# Patient Record
Sex: Female | Born: 1985 | Race: White | Hispanic: No | Marital: Married | Smoking: Never smoker
Health system: Southern US, Community
[De-identification: ages and names within clinical notes are randomized; demographics above are authoritative.]

## PROBLEM LIST (undated history)

## (undated) DIAGNOSIS — Z789 Other specified health status: Secondary | ICD-10-CM

## (undated) HISTORY — PX: NO PAST SURGERIES: SHX2092

## (undated) HISTORY — PX: CHOLECYSTECTOMY: SHX55

---
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
## 2005-07-04 ENCOUNTER — Ambulatory Visit: Payer: Self-pay | Admitting: Family Medicine

## 2005-09-15 ENCOUNTER — Ambulatory Visit: Payer: Self-pay | Admitting: Family Medicine

## 2006-06-13 ENCOUNTER — Ambulatory Visit: Payer: Self-pay | Admitting: Family Medicine

## 2006-09-25 ENCOUNTER — Ambulatory Visit: Payer: Self-pay | Admitting: Family Medicine

## 2006-10-10 ENCOUNTER — Encounter (INDEPENDENT_AMBULATORY_CARE_PROVIDER_SITE_OTHER): Payer: Self-pay | Admitting: Specialist

## 2006-10-10 ENCOUNTER — Inpatient Hospital Stay (HOSPITAL_COMMUNITY): Admission: AD | Admit: 2006-10-10 | Discharge: 2006-10-11 | Payer: Self-pay | Admitting: Obstetrics and Gynecology

## 2006-10-10 ENCOUNTER — Ambulatory Visit (HOSPITAL_COMMUNITY): Admission: RE | Admit: 2006-10-10 | Discharge: 2006-10-10 | Payer: Self-pay | Admitting: Obstetrics and Gynecology

## 2006-10-10 ENCOUNTER — Other Ambulatory Visit: Admission: RE | Admit: 2006-10-10 | Discharge: 2006-10-10 | Payer: Self-pay | Admitting: Obstetrics and Gynecology

## 2006-10-13 ENCOUNTER — Inpatient Hospital Stay (HOSPITAL_COMMUNITY): Admission: AD | Admit: 2006-10-13 | Discharge: 2006-10-13 | Payer: Self-pay | Admitting: Obstetrics and Gynecology

## 2006-10-19 ENCOUNTER — Ambulatory Visit: Payer: Self-pay | Admitting: Family Medicine

## 2007-01-10 ENCOUNTER — Ambulatory Visit: Payer: Self-pay | Admitting: Family Medicine

## 2007-04-24 ENCOUNTER — Other Ambulatory Visit: Admission: RE | Admit: 2007-04-24 | Discharge: 2007-04-24 | Payer: Self-pay | Admitting: Obstetrics and Gynecology

## 2007-07-18 HISTORY — PX: CHOLECYSTECTOMY: SHX55

## 2007-08-05 ENCOUNTER — Inpatient Hospital Stay (HOSPITAL_COMMUNITY): Admission: AD | Admit: 2007-08-05 | Discharge: 2007-08-05 | Payer: Self-pay | Admitting: Obstetrics & Gynecology

## 2007-08-05 ENCOUNTER — Ambulatory Visit: Payer: Self-pay | Admitting: Obstetrics and Gynecology

## 2007-11-01 ENCOUNTER — Ambulatory Visit: Payer: Self-pay | Admitting: Obstetrics and Gynecology

## 2007-11-01 ENCOUNTER — Inpatient Hospital Stay (HOSPITAL_COMMUNITY): Admission: AD | Admit: 2007-11-01 | Discharge: 2007-11-01 | Payer: Self-pay | Admitting: Obstetrics and Gynecology

## 2007-11-04 ENCOUNTER — Ambulatory Visit: Payer: Self-pay | Admitting: Obstetrics and Gynecology

## 2007-11-04 ENCOUNTER — Inpatient Hospital Stay (HOSPITAL_COMMUNITY): Admission: AD | Admit: 2007-11-04 | Discharge: 2007-11-04 | Payer: Self-pay | Admitting: Obstetrics & Gynecology

## 2007-11-05 ENCOUNTER — Inpatient Hospital Stay (HOSPITAL_COMMUNITY): Admission: AD | Admit: 2007-11-05 | Discharge: 2007-11-05 | Payer: Self-pay | Admitting: Obstetrics & Gynecology

## 2007-11-05 ENCOUNTER — Ambulatory Visit: Payer: Self-pay | Admitting: Family

## 2007-11-05 ENCOUNTER — Inpatient Hospital Stay (HOSPITAL_COMMUNITY): Admission: AD | Admit: 2007-11-05 | Discharge: 2007-11-07 | Payer: Self-pay | Admitting: Family Medicine

## 2007-11-09 ENCOUNTER — Ambulatory Visit: Payer: Self-pay | Admitting: Obstetrics and Gynecology

## 2007-11-09 ENCOUNTER — Inpatient Hospital Stay (HOSPITAL_COMMUNITY): Admission: AD | Admit: 2007-11-09 | Discharge: 2007-11-09 | Payer: Self-pay | Admitting: Obstetrics and Gynecology

## 2010-07-17 NOTE — L&D Delivery Note (Signed)
Delivery Note At  a viable female was delivered via  (Presentation: right occiput anterior ;  ).  APGAR:8/9 , ; weight 5lb7.3oz Placenta status: intact, .  Cord: 3 vessel cord with the following complications: none. Donor cord blood sent.   Anesthesia: epidural Episiotomy: none Lacerations: none Suture Repair: na Est. Blood Loss (mL): less than 150  Mom to postpartum.  Baby to nursery-stable.  [name] [name] 02/03/2011, 10:37 AM

## 2010-08-01 ENCOUNTER — Other Ambulatory Visit
Admission: RE | Admit: 2010-08-01 | Discharge: 2010-08-01 | Payer: Self-pay | Source: Home / Self Care | Admitting: Obstetrics and Gynecology

## 2010-10-21 ENCOUNTER — Inpatient Hospital Stay (HOSPITAL_COMMUNITY)
Admission: AD | Admit: 2010-10-21 | Discharge: 2010-10-21 | Disposition: A | Discharge status: Home / Self Care | Payer: Medicaid Other | Source: Ambulatory Visit | Attending: Obstetrics & Gynecology | Admitting: Obstetrics & Gynecology

## 2010-10-21 DIAGNOSIS — O99891 Other specified diseases and conditions complicating pregnancy: Secondary | ICD-10-CM | POA: Insufficient documentation

## 2010-10-21 DIAGNOSIS — N949 Unspecified condition associated with female genital organs and menstrual cycle: Secondary | ICD-10-CM | POA: Insufficient documentation

## 2010-10-21 DIAGNOSIS — E860 Dehydration: Secondary | ICD-10-CM | POA: Insufficient documentation

## 2010-10-21 LAB — URINALYSIS, ROUTINE W REFLEX MICROSCOPIC
Hgb urine dipstick: NEGATIVE
Ketones, ur: 15 mg/dL — AB
Nitrite: NEGATIVE
Urobilinogen, UA: 2 mg/dL — ABNORMAL HIGH (ref 0.0–1.0)
pH: 6 (ref 5.0–8.0)

## 2010-10-21 LAB — WET PREP, GENITAL
Clue Cells Wet Prep HPF POC: NONE SEEN
WBC, Wet Prep HPF POC: NONE SEEN
Yeast Wet Prep HPF POC: NONE SEEN

## 2010-10-23 LAB — URINE CULTURE
Culture  Setup Time: 201204070100
Culture: NO GROWTH

## 2010-12-09 ENCOUNTER — Inpatient Hospital Stay (HOSPITAL_COMMUNITY)
Admission: AD | Admit: 2010-12-09 | Discharge: 2010-12-09 | Disposition: A | Discharge status: Home / Self Care | Payer: Medicaid Other | Source: Ambulatory Visit | Attending: Obstetrics & Gynecology | Admitting: Obstetrics & Gynecology

## 2010-12-09 DIAGNOSIS — O2390 Unspecified genitourinary tract infection in pregnancy, unspecified trimester: Secondary | ICD-10-CM | POA: Insufficient documentation

## 2010-12-09 DIAGNOSIS — O4700 False labor before 37 completed weeks of gestation, unspecified trimester: Secondary | ICD-10-CM

## 2010-12-09 DIAGNOSIS — R109 Unspecified abdominal pain: Secondary | ICD-10-CM | POA: Insufficient documentation

## 2010-12-09 DIAGNOSIS — N390 Urinary tract infection, site not specified: Secondary | ICD-10-CM

## 2010-12-09 LAB — URINALYSIS, ROUTINE W REFLEX MICROSCOPIC
Glucose, UA: NEGATIVE mg/dL
Hgb urine dipstick: NEGATIVE
Protein, ur: NEGATIVE mg/dL
pH: 6 (ref 5.0–8.0)

## 2010-12-09 LAB — URINE MICROSCOPIC-ADD ON

## 2010-12-09 LAB — COMPREHENSIVE METABOLIC PANEL
AST: 14 U/L (ref 0–37)
CO2: 23 mEq/L (ref 19–32)
Calcium: 9.4 mg/dL (ref 8.4–10.5)
Chloride: 99 mEq/L (ref 96–112)
Creatinine, Ser: 0.48 mg/dL (ref 0.4–1.2)
GFR calc non Af Amer: >60 mL/min (ref >60)
Glucose, Bld: 86 mg/dL (ref 70–99)
Total Bilirubin: 0.3 mg/dL (ref 0.3–1.2)

## 2010-12-09 LAB — CBC
MCHC: 34 g/dL (ref 30.0–36.0)
Platelets: 204 10*3/uL (ref 150–400)
RDW: 12.8 % (ref 11.5–15.5)
WBC: 9.3 10*3/uL (ref 4.0–10.5)

## 2010-12-09 LAB — WET PREP, GENITAL
Clue Cells Wet Prep HPF POC: NONE SEEN
Trich, Wet Prep: NONE SEEN
Yeast Wet Prep HPF POC: NONE SEEN

## 2010-12-10 LAB — GC/CHLAMYDIA PROBE AMP, GENITAL: GC Probe Amp, Genital: NEGATIVE

## 2010-12-11 LAB — URINE CULTURE
Colony Count: NO GROWTH
Culture  Setup Time: 201205260550
Culture: NO GROWTH

## 2010-12-14 LAB — ABO/RH: RH Type: POSITIVE

## 2010-12-14 LAB — CBC
HCT: 41 % (ref 36–46)
Hemoglobin: 14.2 g/dL (ref 12.0–16.0)

## 2011-01-31 ENCOUNTER — Encounter (HOSPITAL_COMMUNITY): Payer: Self-pay | Admitting: *Deleted

## 2011-01-31 ENCOUNTER — Inpatient Hospital Stay (HOSPITAL_COMMUNITY)
Admission: AD | Admit: 2011-01-31 | Discharge: 2011-02-01 | Disposition: A | Discharge status: Home / Self Care | Payer: Medicaid Other | Source: Ambulatory Visit | Attending: Obstetrics & Gynecology | Admitting: Obstetrics & Gynecology

## 2011-01-31 DIAGNOSIS — O479 False labor, unspecified: Secondary | ICD-10-CM | POA: Insufficient documentation

## 2011-01-31 DIAGNOSIS — Z3490 Encounter for supervision of normal pregnancy, unspecified, unspecified trimester: Secondary | ICD-10-CM

## 2011-01-31 HISTORY — DX: Other specified health status: Z78.9

## 2011-01-31 NOTE — ED Provider Notes (Signed)
History   Chief Complaint:  Contractions   [name] [name] is  25 y.o. G3P0011 No LMP recorded. Patient is pregnant..  Her pregnancy status is positive.  She is [redacted]w[redacted]d by early ultrasound.  She presents complaining of Contractions She started feeling contractions earlier today, occuring every . They then increased in frequency to evry after 5PM. She denies any leakage of fluid, any vaginal bleeding. She describes some greenish/brownish discharge on Sunday.  Complications this pregnancy include early onset of labor at 29wga with bed rest to 34wga. Given terbutaline.    OB History    Grav Para Term Preterm Abortions TAB SAB Ect Mult Living   3 1   1  1   1     20 09: term NSVD 2008: SAB at 2months  Past Medical History  Diagnosis Date  . No pertinent past medical history     Past Surgical History  Procedure Date  . Cholecystectomy 2009    No family history on file.  History  Substance Use Topics  . Smoking status: Never Smoker   . Smokeless tobacco: Not on file  . Alcohol Use: No    Allergies:  Allergies  Allergen Reactions  . Bee Hives    Prescriptions prior to admission  Medication Sig Dispense Refill  . acetaminophen (TYLENOL) 500 MG tablet Take 250 mg by mouth every 6 (six) hours as needed. pain       . promethazine (PHENERGAN) 25 MG tablet Take 25 mg by mouth every 6 (six) hours as needed. nausea         Review of Systems - Negative except per HPI  Physical Exam   Blood pressure 143/93, pulse 97, temperature 97.6 F (36.4 C), temperature source Oral, resp. rate 18, height 5\' 4"  (1.626 m), weight 161 lb 12.8 oz (73.392 kg), SpO2 97.00%.  General: General appearance - alert, well appearing, and in no distress Chest - clear to auscultation, no wheezes, rales or rhonchi, symmetric air entry Heart - normal rate, regular rhythm, normal S1, S2, no murmurs, rubs, clicks or gallops Abdomen - gravid Extremities - peripheral pulses normal, no pedal edema,  no clubbing or cyanosis Focused Gynecological Exam: SVE: 3/50/-2 SVE after 1hr walking: 3/50/-2 TOCO: q76min FHR: 130s, moderate variability, no decels, present accels.   Labs: No results found for this or any previous visit (from the past 24 hour(s)).  Ultrasound Studies: (NOTE: Ultrasound reports are not yet able to be directly imported into notes.  Please "CUT" this portion of the note and either free text the report or "COPY/PASTE" from the report viewer.)  Assessment: Pt not in active labor.    Plan: 1. Discharge patient home with instructions to return if worsening contractions, loss of fluid or decreased fetal movement. Pt offered ambien but refused it. Discharge Medications: Current Discharge Medication List    CONTINUE these medications which have NOT CHANGED   Details  acetaminophen (TYLENOL) 500 MG tablet Take 250 mg by mouth every 6 (six) hours as needed. pain     promethazine (PHENERGAN) 25 MG tablet Take 25 mg by mouth every 6 (six) hours as needed. nausea          Keilah Lemire 01/31/2011, 11:57 PM

## 2011-01-31 NOTE — Progress Notes (Signed)
Pt G3 P1 at 37wks, having contractions every 10-11min.  Denies bleeding or leaking fluid.

## 2011-01-31 NOTE — Progress Notes (Signed)
Contractions, denies bleeding or ROM 

## 2011-02-02 ENCOUNTER — Encounter (HOSPITAL_COMMUNITY): Payer: Self-pay | Admitting: *Deleted

## 2011-02-02 ENCOUNTER — Inpatient Hospital Stay (HOSPITAL_COMMUNITY)
Admission: AD | Admit: 2011-02-02 | Discharge: 2011-02-05 | DRG: 775 | Disposition: A | Discharge status: Home / Self Care | Payer: Medicaid Other | Source: Ambulatory Visit | Attending: Family Medicine | Admitting: Family Medicine

## 2011-02-02 DIAGNOSIS — IMO0001 Reserved for inherently not codable concepts without codable children: Secondary | ICD-10-CM

## 2011-02-02 DIAGNOSIS — Z3400 Encounter for supervision of normal first pregnancy, unspecified trimester: Secondary | ICD-10-CM

## 2011-02-02 DIAGNOSIS — Z3490 Encounter for supervision of normal pregnancy, unspecified, unspecified trimester: Secondary | ICD-10-CM

## 2011-02-02 LAB — CBC
HCT: 37.8 % (ref 36.0–46.0)
Hemoglobin: 13 g/dL (ref 12.0–15.0)
MCH: 29.1 pg (ref 26.0–34.0)
MCHC: 34.4 g/dL (ref 30.0–36.0)
RBC: 4.47 MIL/uL (ref 3.87–5.11)

## 2011-02-02 MED ORDER — LACTATED RINGERS IV SOLN: INTRAVENOUS | Status: DC

## 2011-02-02 NOTE — Progress Notes (Signed)
Contractions lower abd "8" for 3 hrs really bad.  37wks, G3p1

## 2011-02-03 ENCOUNTER — Encounter (HOSPITAL_COMMUNITY): Payer: Self-pay | Admitting: Anesthesiology

## 2011-02-03 ENCOUNTER — Encounter (HOSPITAL_COMMUNITY): Payer: Self-pay | Admitting: *Deleted

## 2011-02-03 ENCOUNTER — Inpatient Hospital Stay (HOSPITAL_COMMUNITY): Payer: Medicaid Other | Admitting: Anesthesiology

## 2011-02-03 DIAGNOSIS — Z3490 Encounter for supervision of normal pregnancy, unspecified, unspecified trimester: Secondary | ICD-10-CM

## 2011-02-03 MED ORDER — CITRIC ACID-SODIUM CITRATE 334-500 MG/5ML PO SOLN
30.0000 mL | ORAL | Status: DC | PRN
  Administered 2011-02-03: 30 mL via ORAL
  Filled 2011-02-03: qty 15

## 2011-02-03 MED ORDER — PRENATAL PLUS 27-1 MG PO TABS
1.0000 | ORAL_TABLET | Freq: Every day | ORAL | Status: DC
  Administered 2011-02-03 – 2011-02-05 (×3): 1 via ORAL
  Filled 2011-02-03 (×3): qty 1

## 2011-02-03 MED ORDER — ZOLPIDEM TARTRATE 5 MG PO TABS: 5.0000 mg | ORAL_TABLET | Freq: Every evening | ORAL | Status: DC | PRN

## 2011-02-03 MED ORDER — FENTANYL 2.5 MCG/ML BUPIVACAINE 1/10 % EPIDURAL INFUSION (WH - ANES)
14.0000 mL/h | INTRAMUSCULAR | Status: DC
  Administered 2011-02-03 (×3): 14 mL/h via EPIDURAL
  Filled 2011-02-03 (×3): qty 60

## 2011-02-03 MED ORDER — DIPHENHYDRAMINE HCL 25 MG PO CAPS: 25.0000 mg | ORAL_CAPSULE | Freq: Four times a day (QID) | ORAL | Status: DC | PRN

## 2011-02-03 MED ORDER — PHENYLEPHRINE 40 MCG/ML (10ML) SYRINGE FOR IV PUSH (FOR BLOOD PRESSURE SUPPORT)
80.0000 ug | PREFILLED_SYRINGE | INTRAVENOUS | Status: DC | PRN
  Filled 2011-02-03 (×2): qty 5

## 2011-02-03 MED ORDER — OXYCODONE-ACETAMINOPHEN 5-325 MG PO TABS
1.0000 | ORAL_TABLET | ORAL | Status: DC | PRN
  Administered 2011-02-04: 2 via ORAL
  Administered 2011-02-04 (×2): 1 via ORAL
  Filled 2011-02-03 (×2): qty 1
  Filled 2011-02-03: qty 2

## 2011-02-03 MED ORDER — ONDANSETRON HCL 4 MG/2ML IJ SOLN
4.0000 mg | Freq: Four times a day (QID) | INTRAMUSCULAR | Status: DC | PRN
  Administered 2011-02-03 (×2): 4 mg via INTRAVENOUS
  Filled 2011-02-03 (×2): qty 2

## 2011-02-03 MED ORDER — ACETAMINOPHEN 325 MG PO TABS
650.0000 mg | ORAL_TABLET | ORAL | Status: DC | PRN
  Filled 2011-02-03: qty 2

## 2011-02-03 MED ORDER — PHENYLEPHRINE 40 MCG/ML (10ML) SYRINGE FOR IV PUSH (FOR BLOOD PRESSURE SUPPORT)
80.0000 ug | PREFILLED_SYRINGE | INTRAVENOUS | Status: DC | PRN
  Filled 2011-02-03: qty 5

## 2011-02-03 MED ORDER — WITCH HAZEL-GLYCERIN EX PADS: MEDICATED_PAD | CUTANEOUS | Status: DC | PRN

## 2011-02-03 MED ORDER — LACTATED RINGERS IV SOLN
500.0000 mL | INTRAVENOUS | Status: AC | PRN
  Administered 2011-02-03: 500 mL via INTRAVENOUS

## 2011-02-03 MED ORDER — OXYCODONE-ACETAMINOPHEN 5-325 MG PO TABS: 2.0000 | ORAL_TABLET | ORAL | Status: DC | PRN

## 2011-02-03 MED ORDER — FLEET ENEMA 7-19 GM/118ML RE ENEM: 1.0000 | ENEMA | RECTAL | Status: DC | PRN

## 2011-02-03 MED ORDER — LIDOCAINE HCL (PF) 1 % IJ SOLN
30.0000 mL | INTRAMUSCULAR | Status: DC | PRN
  Filled 2011-02-03 (×2): qty 30

## 2011-02-03 MED ORDER — ONDANSETRON HCL 4 MG PO TABS: 4.0000 mg | ORAL_TABLET | ORAL | Status: DC | PRN

## 2011-02-03 MED ORDER — DIPHENHYDRAMINE HCL 50 MG/ML IJ SOLN: 12.5000 mg | INTRAMUSCULAR | Status: DC | PRN

## 2011-02-03 MED ORDER — IBUPROFEN 600 MG PO TABS
600.0000 mg | ORAL_TABLET | Freq: Four times a day (QID) | ORAL | Status: DC | PRN
  Administered 2011-02-03: 600 mg via ORAL
  Filled 2011-02-03: qty 1

## 2011-02-03 MED ORDER — IBUPROFEN 600 MG PO TABS
600.0000 mg | ORAL_TABLET | Freq: Four times a day (QID) | ORAL | Status: DC
  Administered 2011-02-03 – 2011-02-05 (×7): 600 mg via ORAL
  Filled 2011-02-03 (×7): qty 1

## 2011-02-03 MED ORDER — EPHEDRINE 5 MG/ML INJ
10.0000 mg | INTRAVENOUS | Status: DC | PRN
  Administered 2011-02-03: 10 mg via INTRAVENOUS
  Filled 2011-02-03 (×2): qty 4

## 2011-02-03 MED ORDER — OXYTOCIN 20 UNITS IN LACTATED RINGERS INFUSION - SIMPLE
125.0000 mL/h | Freq: Once | INTRAVENOUS | Status: AC
  Administered 2011-02-03: 999 mL/h via INTRAVENOUS
  Filled 2011-02-03: qty 1000

## 2011-02-03 MED ORDER — LACTATED RINGERS IV SOLN
500.0000 mL | Freq: Once | INTRAVENOUS | Status: AC
  Administered 2011-02-03: 500 mL via INTRAVENOUS

## 2011-02-03 MED ORDER — LACTATED RINGERS IV SOLN
INTRAVENOUS | Status: DC
Start: 2011-02-03 — End: 2011-02-03
  Administered 2011-02-03 (×2): via INTRAVENOUS

## 2011-02-03 MED ORDER — TETANUS-DIPHTH-ACELL PERTUSSIS 5-2.5-18.5 LF-MCG/0.5 IM SUSP
0.5000 mL | Freq: Once | INTRAMUSCULAR | Status: AC
  Administered 2011-02-04: 0.5 mL via INTRAMUSCULAR
  Filled 2011-02-03: qty 0.5

## 2011-02-03 MED ORDER — EPHEDRINE 5 MG/ML INJ
10.0000 mg | INTRAVENOUS | Status: DC | PRN
  Filled 2011-02-03: qty 4

## 2011-02-03 MED ORDER — ONDANSETRON HCL 4 MG/2ML IJ SOLN: 4.0000 mg | INTRAMUSCULAR | Status: DC | PRN

## 2011-02-03 MED ORDER — SIMETHICONE 80 MG PO CHEW
80.0000 mg | CHEWABLE_TABLET | ORAL | Status: DC | PRN
  Administered 2011-02-04: 80 mg via ORAL

## 2011-02-03 MED ORDER — LANOLIN HYDROUS EX OINT: TOPICAL_OINTMENT | CUTANEOUS | Status: DC | PRN

## 2011-02-03 MED ORDER — FERROUS SULFATE 325 (65 FE) MG PO TABS
325.0000 mg | ORAL_TABLET | Freq: Two times a day (BID) | ORAL | Status: DC
  Administered 2011-02-03 – 2011-02-05 (×4): 325 mg via ORAL
  Filled 2011-02-03 (×4): qty 1

## 2011-02-03 MED ORDER — NALBUPHINE SYRINGE 5 MG/0.5 ML
5.0000 mg | INJECTION | INTRAMUSCULAR | Status: DC | PRN
  Filled 2011-02-03: qty 0.5

## 2011-02-03 MED ORDER — BENZOCAINE-MENTHOL 20-0.5 % EX AERO: 1.0000 "application " | INHALATION_SPRAY | CUTANEOUS | Status: DC | PRN

## 2011-02-03 MED ORDER — SENNOSIDES-DOCUSATE SODIUM 8.6-50 MG PO TABS
1.0000 | ORAL_TABLET | Freq: Every day | ORAL | Status: DC
  Administered 2011-02-03 – 2011-02-04 (×2): 1 via ORAL

## 2011-02-03 NOTE — Progress Notes (Signed)
[name] [name] is a 25 y.o. G3P0011 at [redacted]w[redacted]d by ultrasound admitted for active labor  Subjective:   Objective: BP 140/88  Pulse 89  Temp(Src) 98 F (36.7 C) (Oral)  Resp 18  Ht 5\' 4"  (1.626 m)  Wt 73.029 kg (161 lb)  BMI 27.64 kg/m2  LMP 05/17/2010      FHT:  FHR: 140 bpm, variability: moderate,  accelerations:  Present,  decelerations:  Absent UC:   regular, every 2-3 minutes SVE:   Dilation: 5 Effacement (%): 50 Station: -2 Exam by:: Telford Nab, CNM  Labs: Lab Results  Component Value Date   WBC 8.9 02/02/2011   HGB 13.0 02/02/2011   HCT 37.8 02/02/2011   MCV 84.6 02/02/2011   PLT 210 02/02/2011    Assessment / Plan: Spontaneous labor, progressing normally  Labor: Progressing normally Preeclampsia:   Fetal Wellbeing:  Category I Pain Control:  Epidural I/D:  n/a Anticipated MOD:  NSVD  [name] [name] Fojtik 02/03/2011, 2:22 AM

## 2011-02-03 NOTE — Discharge Summary (Signed)
  Obstetric Discharge Summary Reason for Admission: onset of labor Prenatal Procedures: ultrasound Intrapartum Procedures: spontaneous vaginal delivery Postpartum Procedures: none Complications-Operative and Postpartum: none  Hemoglobin  Date Value Range Status  02/02/2011 13.0  12.0-15.0 (g/dL) Final     HCT  Date Value Range Status  02/02/2011 37.8  36.0-46.0 (%) Final    Discharge Diagnoses: Term Pregnancy-delivered  Discharge Information: Date: 02/03/2011 Activity: pelvic rest Diet: routine Medications: Ibuprophen and Colace Condition: stable Instructions: refer to practice specific booklet Discharge to: home   Newborn Data: Live born  Information for the patient's newborn:  Xan, Sparkman [[id number]]  female ; APGAR 8/9 , ; weight: 5lb7.3oz ;  Home with mother.  Marena Chancy 02/03/2011, 10:42 AM

## 2011-02-03 NOTE — Anesthesia Procedure Notes (Addendum)
Epidural Patient location during procedure: OB Start time: 02/03/2011 1:54 AM  Staffing Anesthesiologist: Jiles Garter  Preanesthetic Checklist Completed: patient identified, site marked, surgical consent, pre-op evaluation, timeout performed, IV checked, risks and benefits discussed and monitors and equipment checked  Epidural Patient position: sitting Prep: site prepped and draped and DuraPrep Patient monitoring: continuous pulse ox and blood pressure Approach: midline Injection technique: LOR air  Needle:  Needle type: Tuohy  Needle gauge: 17 G Needle length: 9 cm Needle insertion depth: 6 cm Catheter type: closed end flexible Catheter size: 19 Gauge Catheter at skin depth: 12 cm Test dose: negative  Assessment Events: blood not aspirated, injection not painful, no injection resistance, negative IV test and no paresthesia  Additional Notes Dosing of Epidural: 1st dose, Through needle...... 5mg  Marcaine 2nd dose, through catheter.... epi 1:200K + Xylocaine 40 mg 3rd dose, through catheter...Marland KitchenMarland Kitchenepi 1:200K + Xylocaine 60 mg Each dose occurred after waiting 3 min,patient was free of IV sx; and patient exhibits no evidence of SA injection  Patient is more comfortable after epidural dosed. Please see RN's note for documentation of vital signs,and FHR which are stable.

## 2011-02-03 NOTE — Progress Notes (Signed)
[name] [name] is a 25 y.o. G3P0011 at [redacted]w[redacted]d by ultrasound admitted for active labor  Subjective:   Objective: BP 127/82  Pulse 76  Temp(Src) 97.5 F (36.4 C) (Axillary)  Resp 20  Ht 5\' 4"  (1.626 m)  Wt 73.029 kg (161 lb)  BMI 27.64 kg/m2  LMP 05/17/2010      FHT:  FHR: 125 bpm, variability: moderate,  accelerations:  Present,  decelerations:  Absent UC:   regular, every 2-4 minutes SVE:   Dilation: 7 Effacement (%): 80 Station: -2 Exam by:: dr. Edmonia James AROM with clear fluid   Assessment / Plan: Spontaneous labor, progressing normally  Labor: Progressing normally Fetal Wellbeing:  Reassuring FHRI Pain Control:  Epidural I/D:  GBS neg Anticipated MOD:  NSVD  Emsley Custer 02/03/2011, 8:57 AM

## 2011-02-03 NOTE — Progress Notes (Signed)
Delivery supervised and attended.  Agree with delivery note by PGY1. S/p SVD viable female, apgars 8/9, wt 5lbs 7.2oz, no lacerations, EBL <500, good uterine firming with fundal massage and pitocin. Mother stable to pp floor. Infant to NBN.

## 2011-02-03 NOTE — Progress Notes (Signed)
EDMONIA GONSER is a 25 y.o. G3P0011 at [redacted]w[redacted]d by ultrasound admitted for active labor  Subjective:   Objective: BP 126/83  Pulse 77  Temp(Src) 97.9 F (36.6 C) (Oral)  Resp 16  Ht 5\' 4"  (1.626 m)  Wt 73.029 kg (161 lb)  BMI 27.64 kg/m2  LMP 05/17/2010      FHT:  FHR: 130 bpm, variability: moderate,  accelerations:  Present,  decelerations:  Absent UC:   regular, every 2-3 minutes SVE:   Dilation: 7 Effacement (%): 50 Station: -2 Exam by:: FCresnzo AROM with clear fluid Labs: Lab Results  Component Value Date   WBC 8.9 02/02/2011   HGB 13.0 02/02/2011   HCT 37.8 02/02/2011   MCV 84.6 02/02/2011   PLT 210 02/02/2011    Assessment / Plan: Spontaneous labor, progressing normally  Labor: Progressing normally Preeclampsia:   Fetal Wellbeing:  Category I Pain Control:  Epidural I/D:  n/a Anticipated MOD:  NSVD  CRESENZO-DISHMAN,Leza Apsey 02/03/2011, 6:12 AM

## 2011-02-03 NOTE — Anesthesia Preprocedure Evaluation (Signed)
Anesthesia Evaluation  Name, MR# and DOB Patient awake  General Assessment Comment  Reviewed: Allergy & Precautions, H&P  and Patient's Chart, lab work & pertinent test results  Airway Mallampati: II TM Distance: >3 FB Neck ROM: full    Dental  (+) Teeth Intact   Pulmonary  clear to auscultation    Cardiovascular regular Normal   Neuro/Psych  GI/Hepatic/Renal   Endo/Other   Abdominal   Musculoskeletal  Hematology   Peds  Reproductive/Obstetrics (+) Pregnancy   Anesthesia Other Findings                 Anesthesia Physical Anesthesia Plan  ASA: II  Anesthesia Plan: Epidural   Post-op Pain Management:    Induction:   Airway Management Planned:   Additional Equipment:   Intra-op Plan:   Post-operative Plan:   Informed Consent: I have reviewed the patients History and Physical, chart, labs and discussed the procedure including the risks, benefits and alternatives for the proposed anesthesia with the patient or authorized representative who has indicated his/her understanding and acceptance.   Dental Advisory Given  Plan Discussed with: CRNA and Surgeon  Anesthesia Plan Comments: (Labs checked- platelets confirmed with RN in room. Fetal heart tracing, per RN, reportedly stable enough for sitting procedure. Discussed epidural, and patient consents to the procedure:  included risk of possible headache,backache, failed block, allergic reaction, and nerve injury. This patient was asked if she had any questions or concerns before the procedure started. )        Anesthesia Quick Evaluation  

## 2011-02-03 NOTE — Progress Notes (Signed)
UR chart review completed.  

## 2011-02-04 NOTE — Progress Notes (Signed)
Post Partum Day 1 Subjective: no complaints, up ad lib, voiding, tolerating PO and denies CP, SOB, lightheadness or dizziness  Objective: Blood pressure 109/75, pulse 76, temperature 97.6 F (36.4 C), temperature source Oral, resp. rate 18, height 5\' 4"  (1.626 m), weight 73.029 kg (161 lb), last menstrual period 05/17/2010, SpO2 97.00%, unknown if currently breastfeeding.  Physical Exam:  General: alert and no distress Lochia: appropriate Uterine Fundus: firm DVT Evaluation: No evidence of DVT seen on physical exam. No cords or calf tenderness. No significant calf/ankle edema.   Basename 02/02/11 2330  HGB 13.0  HCT 37.8    Assessment/Plan: Plan for discharge tomorrow and Contraception unknown. Options reviewed with the patient. Patient may want to be discharged this afternoon.   LOS: 2 days   [name] [name] 02/04/2011, 7:49 AM

## 2011-02-05 MED ORDER — WITCH HAZEL-GLYCERIN EX PADS: MEDICATED_PAD | CUTANEOUS | Status: DC | PRN

## 2011-02-05 MED ORDER — BENZOCAINE-MENTHOL 20-0.5 % EX AERO: 1.0000 "application " | INHALATION_SPRAY | CUTANEOUS | Status: DC | PRN

## 2011-02-05 MED ORDER — SENNOSIDES-DOCUSATE SODIUM 8.6-50 MG PO TABS: 1.0000 | ORAL_TABLET | Freq: Every day | ORAL | Status: DC

## 2011-02-05 MED ORDER — FERROUS SULFATE 325 (65 FE) MG PO TABS: 325.0000 mg | ORAL_TABLET | Freq: Two times a day (BID) | ORAL | Status: DC

## 2011-02-05 MED ORDER — ONDANSETRON HCL 4 MG PO TABS: 4.0000 mg | ORAL_TABLET | ORAL | Status: DC | PRN

## 2011-02-05 MED ORDER — OXYCODONE-ACETAMINOPHEN 5-325 MG PO TABS: 1.0000 | ORAL_TABLET | ORAL | Status: AC | PRN

## 2011-02-05 MED ORDER — IBUPROFEN 600 MG PO TABS: 600.0000 mg | ORAL_TABLET | Freq: Four times a day (QID) | ORAL | Status: AC

## 2011-02-05 MED ORDER — ZOLPIDEM TARTRATE 5 MG PO TABS: 5.0000 mg | ORAL_TABLET | Freq: Every evening | ORAL | Status: DC | PRN

## 2011-02-05 MED ORDER — DIPHENHYDRAMINE HCL 25 MG PO CAPS: 25.0000 mg | ORAL_CAPSULE | Freq: Four times a day (QID) | ORAL | Status: DC | PRN

## 2011-02-05 MED ORDER — PRENATAL PLUS 27-1 MG PO TABS: 1.0000 | ORAL_TABLET | Freq: Every day | ORAL | Status: DC

## 2011-02-05 MED ORDER — ONDANSETRON HCL 4 MG/2ML IJ SOLN: 4.0000 mg | INTRAMUSCULAR | Status: DC | PRN

## 2011-02-05 MED ORDER — IBUPROFEN 600 MG PO TABS: 600.0000 mg | ORAL_TABLET | Freq: Four times a day (QID) | ORAL | Status: DC

## 2011-02-05 MED ORDER — LANOLIN HYDROUS EX OINT: TOPICAL_OINTMENT | CUTANEOUS | Status: DC | PRN

## 2011-02-05 MED ORDER — TETANUS-DIPHTH-ACELL PERTUSSIS 5-2.5-18.5 LF-MCG/0.5 IM SUSP: 0.5000 mL | Freq: Once | INTRAMUSCULAR | Status: DC

## 2011-02-05 MED ORDER — SIMETHICONE 80 MG PO CHEW: 80.0000 mg | CHEWABLE_TABLET | ORAL | Status: DC | PRN

## 2011-02-05 MED ORDER — OXYCODONE-ACETAMINOPHEN 5-325 MG PO TABS: 1.0000 | ORAL_TABLET | ORAL | Status: DC | PRN

## 2011-02-05 NOTE — Discharge Summary (Signed)
Obstetric Discharge Summary Reason for Admission: onset of labor Prenatal Procedures: ultrasound Intrapartum Procedures: spontaneous vaginal delivery Postpartum Procedures: none Complications-Operative and Postpartum: none  Hemoglobin  Date Value Range Status  02/02/2011 13.0  12.0-15.0 (g/dL) Final     HCT  Date Value Range Status  02/02/2011 37.8  36.0-46.0 (%) Final    Discharge Diagnoses: Term Pregnancy-delivered  Discharge Information: Date: 02/05/2011 Activity: pelvic rest Diet: routine Medications: PNV, Ibuprophen and Percocet Condition: stable Instructions: refer to practice specific booklet Discharge to: home Follow-up Information    Make an appointment in 4 weeks to follow up.         Newborn Data: Live born  Information for the patient's newborn:  Anastasiya, Gowin [[id number]]  female ; APGAR , ; weight ;  Home with mother.  Zerita Boers 02/05/2011, 10:01 AM

## 2011-02-05 NOTE — Progress Notes (Signed)
Post Partum Day 2 Subjective: no complaints, up ad lib, voiding, tolerating PO and + flatus  Objective: Blood pressure 100/59, pulse 47, temperature 98.3 F (36.8 C), temperature source Oral, resp. rate 18, height 5\' 4"  (1.626 m), weight 73.029 kg (161 lb), last menstrual period 05/17/2010, SpO2 97.00%, unknown if currently breastfeeding.  Physical Exam:  General: alert, cooperative and appears stated age Lochia: appropriate Uterine Fundus: firm Incision:  DVT Evaluation: No evidence of DVT seen on physical exam. Negative Homan's sign. No cords or calf tenderness. No significant calf/ankle edema.   Basename 02/02/11 2330  HGB 13.0  HCT 37.8    Assessment/Plan: Discharge home   LOS: 3 days   Zerita Boers 02/05/2011, 9:48 AM

## 2011-02-08 NOTE — Anesthesia Postprocedure Evaluation (Signed)
  Anesthesia Post-op Note  Patient: [name] [name] This patient has recovered from her labor epidural, and I am not aware of any complications or problems.

## 2011-02-08 NOTE — Addendum Note (Signed)
Addendum  created 02/08/11 1145 by Jiles Garter   Modules edited:Notes Section

## 2011-02-23 NOTE — H&P (Signed)
  Chief Complaint:  Contractions   HPI:   [name] [name] is a 25 y.o. [id number]  with Estimated Date of Delivery: 02/21/11 by LMP , now at  [redacted]w[redacted]d weeks gestation who presents with complaint of  contractions every 3 minutes lasting 60 seconds. Patient reports the fetal movement as active,  vaginal bleeding as none and  she describes fluid per vagina as None. She receives her prenatal care at  [facility] and her prenatal course has been complicated by nothing.   Past Obstetrical History: OB History as of 02/05/11    Grav Para Term Preterm Abortions TAB SAB Ect Mult Living   3 2 1  1  1   2       Past Medical History: Past Medical History  Diagnosis Date  . No pertinent past medical history     Past Surgical History: Past Surgical History  Procedure Date  . Cholecystectomy 2009  . Cholecystectomy   . No past surgeries     Family History: No family history on file.  Social History: History  Substance Use Topics  . Smoking status: Never Smoker   . Smokeless tobacco: Not on file  . Alcohol Use: No    Allergies:  Allergies  Allergen Reactions  . Bee Hives    Home Medications: No prescriptions prior to admission    General ROS:  negative Focused OB Physical: Cervical Exam: 4-5/long/-2 Fetal presentation: cephalic Membranes:intact  External Fetal Monitoring:  Baseline: 140 bpm and contractions are regular, every 3 minuteswith moderate intensity.  Labs: No results found for this or any previous visit (from the past 24 hour(s)).  ASSESSMENTLEYANA WHIDDEN [id number] [redacted]w[redacted]d at weeks gestation  Active labor  PLAN: Admit for labor management  CRESENZO-DISHMAN,Valerya Maxton 02/23/2011,10:15 AM

## 2011-04-06 LAB — URINALYSIS, ROUTINE W REFLEX MICROSCOPIC
Hgb urine dipstick: NEGATIVE
Nitrite: NEGATIVE
Specific Gravity, Urine: 1.025
Urobilinogen, UA: 1

## 2011-04-06 LAB — CBC
MCHC: 35.2
MCV: 88.4
Platelets: 251

## 2011-04-06 LAB — COMPREHENSIVE METABOLIC PANEL
AST: 15
Albumin: 2.5 — ABNORMAL LOW
Calcium: 8.7
Creatinine, Ser: 0.43
GFR calc Af Amer: >60

## 2011-04-06 LAB — URINE MICROSCOPIC-ADD ON

## 2011-04-11 LAB — URINALYSIS, ROUTINE W REFLEX MICROSCOPIC
Glucose, UA: NEGATIVE
Ketones, ur: 15 — AB
Nitrite: NEGATIVE
Protein, ur: 30 — AB
Specific Gravity, Urine: 1.025
Urobilinogen, UA: 1
pH: 6.5

## 2011-04-11 LAB — CBC
HCT: 28.8 — ABNORMAL LOW
HCT: 36.2
Hemoglobin: 10.1 — ABNORMAL LOW
Hemoglobin: 12.7
MCHC: 35.1
MCHC: 35.3
MCV: 84.1
MCV: 84.8
Platelets: 256
Platelets: 270
RBC: 3.39 — ABNORMAL LOW
RBC: 4.31
RDW: 12.8
RDW: 13.5
WBC: 10.6 — ABNORMAL HIGH
WBC: 14 — ABNORMAL HIGH

## 2011-04-11 LAB — HEMATOCRIT: HCT: 28.3 — ABNORMAL LOW

## 2011-04-11 LAB — URINE MICROSCOPIC-ADD ON

## 2011-04-11 LAB — SYPHILIS: RPR W/REFLEX TO RPR TITER AND TREPONEMAL ANTIBODIES, TRADITIONAL SCREENING AND DIAGNOSIS ALGORITHM: RPR Ser Ql: NONREACTIVE

## 2012-03-29 ENCOUNTER — Other Ambulatory Visit: Payer: Self-pay | Admitting: Adult Health

## 2012-03-29 ENCOUNTER — Other Ambulatory Visit (HOSPITAL_COMMUNITY)
Admission: RE | Admit: 2012-03-29 | Discharge: 2012-03-29 | Disposition: A | Discharge status: Home / Self Care | Payer: Medicaid Other | Source: Ambulatory Visit | Attending: Obstetrics and Gynecology | Admitting: Obstetrics and Gynecology

## 2012-03-29 DIAGNOSIS — Z01419 Encounter for gynecological examination (general) (routine) without abnormal findings: Secondary | ICD-10-CM | POA: Insufficient documentation

## 2012-03-29 DIAGNOSIS — Z113 Encounter for screening for infections with a predominantly sexual mode of transmission: Secondary | ICD-10-CM | POA: Insufficient documentation

## 2013-05-13 ENCOUNTER — Encounter (HOSPITAL_COMMUNITY): Payer: Self-pay | Admitting: Emergency Medicine

## 2013-05-13 ENCOUNTER — Emergency Department (HOSPITAL_COMMUNITY): Payer: BC Managed Care – PPO

## 2013-05-13 ENCOUNTER — Emergency Department (HOSPITAL_COMMUNITY)
Admission: EM | Admit: 2013-05-13 | Discharge: 2013-05-13 | Disposition: A | Discharge status: Home / Self Care | Payer: BC Managed Care – PPO | Attending: Emergency Medicine | Admitting: Emergency Medicine

## 2013-05-13 DIAGNOSIS — R109 Unspecified abdominal pain: Secondary | ICD-10-CM | POA: Insufficient documentation

## 2013-05-13 DIAGNOSIS — R110 Nausea: Secondary | ICD-10-CM | POA: Insufficient documentation

## 2013-05-13 DIAGNOSIS — Z9089 Acquired absence of other organs: Secondary | ICD-10-CM | POA: Insufficient documentation

## 2013-05-13 DIAGNOSIS — R197 Diarrhea, unspecified: Secondary | ICD-10-CM | POA: Insufficient documentation

## 2013-05-13 DIAGNOSIS — Z3202 Encounter for pregnancy test, result negative: Secondary | ICD-10-CM | POA: Insufficient documentation

## 2013-05-13 LAB — COMPREHENSIVE METABOLIC PANEL
ALT: 16 U/L (ref 0–35)
AST: 17 U/L (ref 0–37)
Albumin: 3.9 g/dL (ref 3.5–5.2)
Chloride: 99 mEq/L (ref 96–112)
Creatinine, Ser: 0.7 mg/dL (ref 0.50–1.10)
Potassium: 3.3 mEq/L — ABNORMAL LOW (ref 3.5–5.1)
Sodium: 136 mEq/L (ref 135–145)
Total Bilirubin: 0.4 mg/dL (ref 0.3–1.2)

## 2013-05-13 LAB — CBC WITH DIFFERENTIAL/PLATELET
Basophils Absolute: 0 10*3/uL (ref 0.0–0.1)
Basophils Relative: 0 % (ref 0–1)
MCHC: 35 g/dL (ref 30.0–36.0)
Neutro Abs: 6.7 10*3/uL (ref 1.7–7.7)
Neutrophils Relative %: 64 % (ref 43–77)
Platelets: 252 10*3/uL (ref 150–400)
RDW: 12.2 % (ref 11.5–15.5)
WBC: 10.5 10*3/uL (ref 4.0–10.5)

## 2013-05-13 LAB — URINALYSIS, ROUTINE W REFLEX MICROSCOPIC
Glucose, UA: NEGATIVE mg/dL
Hgb urine dipstick: NEGATIVE
Protein, ur: NEGATIVE mg/dL

## 2013-05-13 LAB — WET PREP, GENITAL
Trich, Wet Prep: NONE SEEN
Yeast Wet Prep HPF POC: NONE SEEN

## 2013-05-13 LAB — GC/CHLAMYDIA PROBE AMP: CT Probe RNA: NEGATIVE

## 2013-05-13 LAB — URINE MICROSCOPIC-ADD ON

## 2013-05-13 MED ORDER — HYDROCODONE-ACETAMINOPHEN 5-325 MG PO TABS: 1.0000 | ORAL_TABLET | ORAL | Status: DC | PRN

## 2013-05-13 MED ORDER — ONDANSETRON HCL 8 MG PO TABS: 8.0000 mg | ORAL_TABLET | Freq: Three times a day (TID) | ORAL | Status: DC | PRN

## 2013-05-13 MED ORDER — HYDROCODONE-ACETAMINOPHEN 5-325 MG PO TABS
2.0000 | ORAL_TABLET | Freq: Once | ORAL | Status: AC
  Administered 2013-05-13: 2 via ORAL
  Filled 2013-05-13: qty 2

## 2013-05-13 NOTE — ED Provider Notes (Signed)
CSN: [id number]     Arrival date & time 05/13/13  0113 History   First MD Initiated Contact with Patient 05/13/13 0147     Chief Complaint  Patient presents with  . Abdominal Pain   (Consider location/radiation/quality/duration/timing/severity/associated sxs/prior Treatment) HPI History provided by pt.   Pt has had constant pain in LLQ that radiates across lower abd for the past two days.  Describes as pressure; it feels like she about to have a baby.  Aggravated by walking, urinating and laying flat.  Associated w/ nausea and loose stool.  Denies fever, hematemesis/hematochezia/melena, other GU sx.  H/o cholecystectomy; no other pertinent PMH. Past Medical History  Diagnosis Date  . No pertinent past medical history    Past Surgical History  Procedure Laterality Date  . Cholecystectomy  2009  . Cholecystectomy    . No past surgeries     Family History  Problem Relation Age of Onset  . Kidney Stones Sister   . Diabetes Other    History  Substance Use Topics  . Smoking status: Never Smoker   . Smokeless tobacco: Not on file  . Alcohol Use: No   OB History   Grav Para Term Preterm Abortions TAB SAB Ect Mult Living   3 2 1  1  1   2      Review of Systems  All other systems reviewed and are negative.    Allergies  Bee venom  Home Medications   Current Outpatient Rx  Name  Route  Sig  Dispense  Refill  . acetaminophen (TYLENOL) 500 MG tablet   Oral   Take 250 mg by mouth every 6 (six) hours as needed. pain          . ibuprofen (ADVIL,MOTRIN) 200 MG tablet   Oral   Take 200 mg by mouth every 6 (six) hours as needed for pain (pain).         . EXPIRED: ferrous sulfate 325 (65 FE) MG tablet   Oral   Take 1 tablet (325 mg total) by mouth 2 (two) times daily with a meal.   30 tablet   3    BP 139/76  Pulse 77  Temp(Src) 97.9 F (36.6 C) (Oral)  Resp 16  SpO2 97%  LMP 05/03/2013 Physical Exam  Nursing note and vitals reviewed. Constitutional: She is  oriented to person, place, and time. She appears well-developed and well-nourished. No distress.  HENT:  Head: Normocephalic and atraumatic.  Eyes:  Normal appearance  Neck: Normal range of motion.  Cardiovascular: Normal rate and regular rhythm.   Pulmonary/Chest: Effort normal and breath sounds normal. No respiratory distress.  Abdominal: Soft. Bowel sounds are normal. She exhibits no distension and no mass. There is no rebound and no guarding.  Tenderness across lower abdomen, reportedly worse in LLQ and L suprapubic.  Genitourinary:  No CVA tenderness  Musculoskeletal: Normal range of motion.  Neurological: She is alert and oriented to person, place, and time.  Skin: Skin is warm and dry. No rash noted.  Psychiatric: She has a normal mood and affect. Her behavior is normal.    ED Course  Procedures (including critical care time) Labs Review Labs Reviewed  COMPREHENSIVE METABOLIC PANEL - Abnormal; Notable for the following:    Potassium 3.3 (*)    Glucose, Bld 102 (*)    All other components within normal limits  URINALYSIS, ROUTINE W REFLEX MICROSCOPIC - Abnormal; Notable for the following:    APPearance CLOUDY (*)  Specific Gravity, Urine 1.031 (*)    Leukocytes, UA SMALL (*)    All other components within normal limits  URINE MICROSCOPIC-ADD ON - Abnormal; Notable for the following:    Squamous Epithelial / LPF FEW (*)    Bacteria, UA FEW (*)    All other components within normal limits  WET PREP, GENITAL  GC/CHLAMYDIA PROBE AMP  CBC WITH DIFFERENTIAL  POCT PREGNANCY, URINE   Imaging Review No results found.  EKG Interpretation   None       MDM   1. Abdominal pain    27yo healthy F presents w/ left lower abdominal pain x 2 days.  Aggravated by urination, walking and laying flat and associated w/ nausea and loose stool.  On exam, afebrile, non-toxic appearing, abd soft/non-distended, suprapubic and bilateral LQ ttp, worse on left.  Cervix friable and  diffusely tender on bimanual exam.  Pt dehydrated and mildly hypokalemic but labs otherwise unremarkable.  Transvaginal US ordered and pending. 3:02 AM   US unremarkable.  Results discussed w/ pt.  Her pain is minimal currently after receiving one vicodin.  On repeat exam, tenderness decreased and most prominent mid-line and L lower abd.  Low suspicion for appendicitis. Suspect mild colitis. D/c'd home w/ vicodin and zofran.  Recommended f/u w/ gynecologist and return to ER for worsening pain, uncontrolled vomiting, associated fever.  4:24 AM     Otilio Miu, PA-C 05/13/13 [phone]

## 2013-05-13 NOTE — ED Notes (Signed)
Pt states she started having abd pain on Sunday in her upper abdomen and over time the pain has moved down into her left lower quadrant and is now radiating around to the right  Pt states it feels like a lot of pressure  Pt states she had 3 BMs yesterday so she knows she is not constipated  Pt the more pain she has the more nauseated she becomes but has not had vomiting yet

## 2013-05-13 NOTE — ED Provider Notes (Signed)
Medical screening examination/treatment/procedure(s) were performed by non-physician practitioner and as supervising physician I was immediately available for consultation/collaboration.  EKG Interpretation   None         Lyanne Co, MD 05/13/13 [phone]

## 2013-05-22 ENCOUNTER — Other Ambulatory Visit: Payer: Self-pay

## 2013-07-14 ENCOUNTER — Emergency Department (HOSPITAL_COMMUNITY)
Admission: EM | Admit: 2013-07-14 | Discharge: 2013-07-14 | Disposition: A | Discharge status: Home / Self Care | Payer: BC Managed Care – PPO | Attending: Emergency Medicine | Admitting: Emergency Medicine

## 2013-07-14 ENCOUNTER — Encounter (HOSPITAL_COMMUNITY): Payer: Self-pay | Admitting: Emergency Medicine

## 2013-07-14 DIAGNOSIS — R000 Tachycardia, unspecified: Secondary | ICD-10-CM | POA: Insufficient documentation

## 2013-07-14 DIAGNOSIS — R5381 Other malaise: Secondary | ICD-10-CM | POA: Insufficient documentation

## 2013-07-14 DIAGNOSIS — J029 Acute pharyngitis, unspecified: Secondary | ICD-10-CM | POA: Insufficient documentation

## 2013-07-14 DIAGNOSIS — IMO0001 Reserved for inherently not codable concepts without codable children: Secondary | ICD-10-CM | POA: Insufficient documentation

## 2013-07-14 DIAGNOSIS — R52 Pain, unspecified: Secondary | ICD-10-CM | POA: Insufficient documentation

## 2013-07-14 LAB — RAPID STREP SCREEN (MED CTR MEBANE ONLY): Streptococcus, Group A Screen (Direct): NEGATIVE

## 2013-07-14 MED ORDER — HYDROCODONE-ACETAMINOPHEN 7.5-325 MG/15ML PO SOLN: 15.0000 mL | Freq: Three times a day (TID) | ORAL | Status: AC | PRN

## 2013-07-14 MED ORDER — IBUPROFEN 800 MG PO TABS
800.0000 mg | ORAL_TABLET | Freq: Once | ORAL | Status: AC
  Administered 2013-07-14: 800 mg via ORAL
  Filled 2013-07-14: qty 1

## 2013-07-14 NOTE — ED Notes (Signed)
Pt is here c/o sore throat that started yesterday; chills, body aches, headache as well.

## 2013-07-14 NOTE — ED Provider Notes (Signed)
CSN: [id number]     Arrival date & time 07/14/13  1926 History  This chart was scribed for non-physician practitioner, Antony Madura, PA-C working with Nelia Shi, MD by Greggory Stallion, ED scribe. This patient was seen in room WTR6/WTR6 and the patient's care was started at 10:06 PM.   Chief Complaint  Patient presents with  . Sore Throat   The history is provided by the patient. No language interpreter was used.   HPI Comments: [name] [name] is a 27 y.o. female who presents to the Emergency Department complaining of sore throat, generalized body aches and headache that started yesterday. She states she is also having fever, chills, congestion, productive cough and postnasal drip. Pt has taken ibuprofen with little relief of fever but no other symptoms. Denies rhinorrhea and shortness of breath.   Past Medical History  Diagnosis Date  . No pertinent past medical history    Past Surgical History  Procedure Laterality Date  . Cholecystectomy  2009  . Cholecystectomy    . No past surgeries     Family History  Problem Relation Age of Onset  . Kidney Stones Sister   . Diabetes Other    History  Substance Use Topics  . Smoking status: Never Smoker   . Smokeless tobacco: Not on file  . Alcohol Use: No   OB History   Grav Para Term Preterm Abortions TAB SAB Ect Mult Living   3 2 1  1  1   2      Review of Systems  Constitutional: Positive for fever, chills and fatigue.  HENT: Positive for congestion, postnasal drip and sore throat. Negative for rhinorrhea.   Respiratory: Positive for cough.   Musculoskeletal: Positive for myalgias.  Neurological: Positive for headaches.  All other systems reviewed and are negative.   Allergies  Bee venom  Home Medications   Current Outpatient Rx  Name  Route  Sig  Dispense  Refill  . ibuprofen (ADVIL,MOTRIN) 200 MG tablet   Oral   Take 400 mg by mouth every 6 (six) hours as needed for pain (pain).          Marland Kitchen  HYDROcodone-acetaminophen (HYCET) 7.5-325 mg/15 ml solution   Oral   Take 15 mLs by mouth every 8 (eight) hours as needed for moderate pain.   120 mL   0    BP 111/76  Pulse 116  Temp(Src) 98.8 F (37.1 C) (Oral)  Resp 18  Ht 5\' 5"  (1.651 m)  Wt 170 lb (77.111 kg)  BMI 28.29 kg/m2  SpO2 99%  LMP 06/30/2013  Physical Exam  Nursing note and vitals reviewed. Constitutional: She is oriented to person, place, and time. She appears well-developed and well-nourished. No distress.  HENT:  Head: Normocephalic and atraumatic.  Right Ear: Tympanic membrane, external ear and ear canal normal.  Left Ear: Tympanic membrane, external ear and ear canal normal.  Nose: Nose normal.  Mouth/Throat: Uvula is midline and mucous membranes are normal. No trismus in the jaw. Oropharyngeal exudate and posterior oropharyngeal erythema present. No posterior oropharyngeal edema or tonsillar abscesses.  Bilateral tonsillar enlargement with erythema and exudate. Pt tolerating secretions. Uvula midline.  Eyes: Conjunctivae and EOM are normal. No scleral icterus.  Neck: Normal range of motion. Neck supple.  No nuchal rigidity or meningismus  Cardiovascular: Regular rhythm and normal heart sounds.  Tachycardia present.   Pulmonary/Chest: Effort normal and breath sounds normal. No stridor. No respiratory distress. She has no wheezes. She has no  rhonchi. She has no rales.  Abdominal: Soft. There is no tenderness.  Musculoskeletal: Normal range of motion.  Lymphadenopathy:    She has cervical adenopathy (anterior, bilaterally).  Neurological: She is alert and oriented to person, place, and time.  Skin: Skin is warm and dry. No rash noted. She is not diaphoretic. No erythema. No pallor.  Psychiatric: She has a normal mood and affect. Her behavior is normal.    ED Course  Procedures (including critical care time)  DIAGNOSTIC STUDIES: Oxygen Saturation is 99% on RA, normal by my interpretation.     COORDINATION OF CARE: 10:11 PM-Discussed treatment plan which includes strep test with pt at bedside and pt agreed to plan.   Labs Review Labs Reviewed  RAPID STREP SCREEN  CULTURE, GROUP A STREP   Imaging Review No results found.  EKG Interpretation   None       MDM   1. Viral pharyngitis    Uncomplicated viral pharyngitis. Patient well and nontoxic appearing and hemodynamically stable. Uvula midline without evidence of peritonsillar abscess. No nuchal rigidity or meningeal signs. Airway patent patient tolerating secretions without difficulty. Rapid strep screen negative today. Patient stable for discharge with prescription for Hycet. Saltwater gargles and ibuprofen also advised. Return precautions provided and patient agreeable to plan with no unaddressed concerns.  I personally performed the services described in this documentation, which was scribed in my presence. The recorded information has been reviewed and is accurate.   Filed Vitals:   07/14/13 1927  BP: 111/76  Pulse: 116  Temp: 98.8 F (37.1 C)  TempSrc: Oral  Resp: 18  Height: 5\' 5"  (1.651 m)  Weight: 170 lb (77.111 kg)  SpO2: 99%     Antony Madura, PA-C 07/14/13 2240

## 2013-07-14 NOTE — ED Notes (Signed)
Pt c/o fever, chills, sore throat

## 2013-07-16 LAB — CULTURE, GROUP A STREP

## 2013-07-17 NOTE — ED Provider Notes (Signed)
Medical screening examination/treatment/procedure(s) were performed by non-physician practitioner and as supervising physician I was immediately available for consultation/collaboration.   Paul Trettin L De Libman, MD 07/17/13 1041 

## 2014-05-08 IMAGING — US US PELVIS COMPLETE
1 series · 14 of 25 positions shown · non-contrast
Comparison: None

CLINICAL DATA: Pelvic pain, left more than right.

EXAM:
TRANSABDOMINAL AND TRANSVAGINAL ULTRASOUND OF PELVIS
TECHNIQUE: Both transabdominal and transvaginal ultrasound examinations of the
pelvis were performed. Transabdominal technique was performed for
global imaging of the pelvis including uterus, ovaries, adnexal
regions, and pelvic cul-de-sac. It was necessary to proceed with
endovaginal exam following the transabdominal exam to visualize the
endometrium and the adnexa.

[Series 1: us pelvis complete · 0.21mm/px · 14 of 52 slices shown]
[im 1/52]
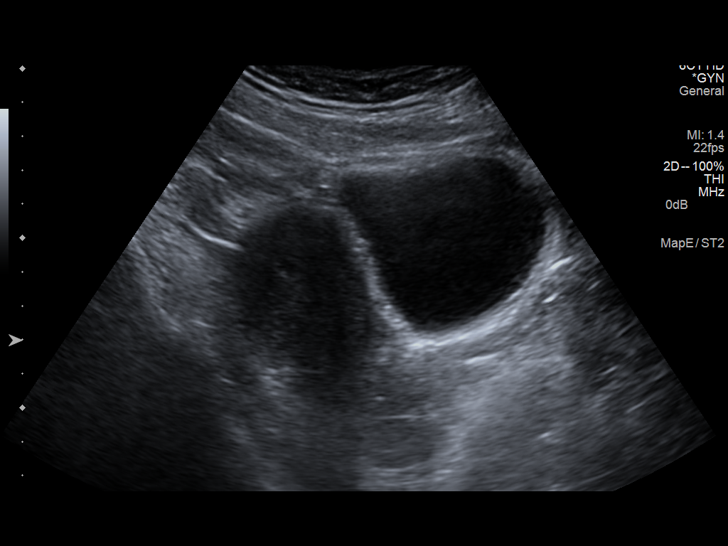
[im 5/52]
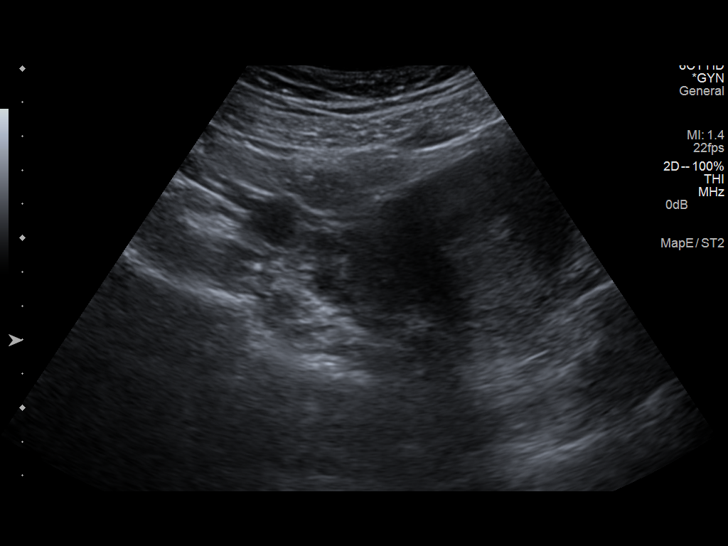
[im 9/52]
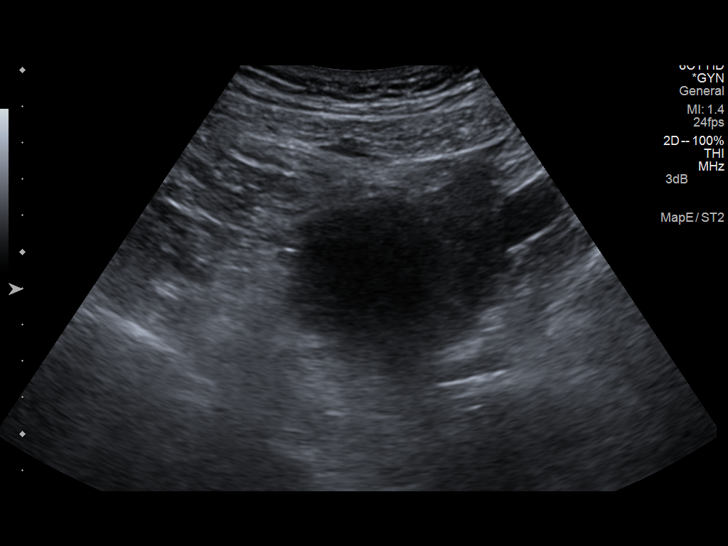
[im 13/52]
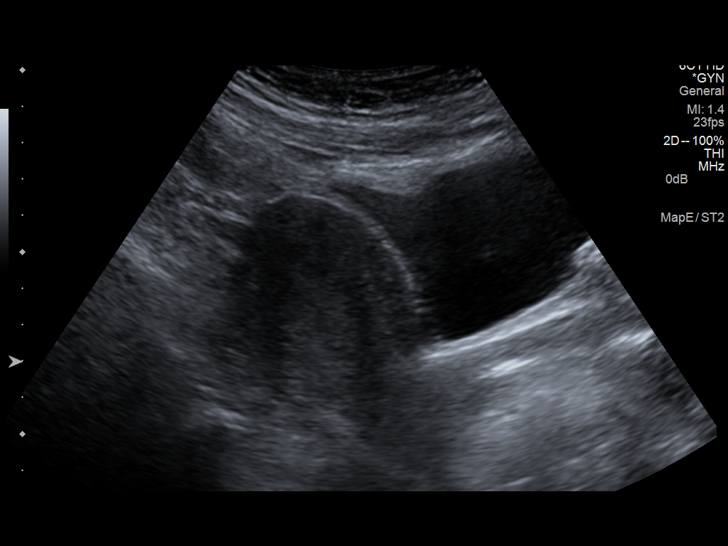
[im 18/52]
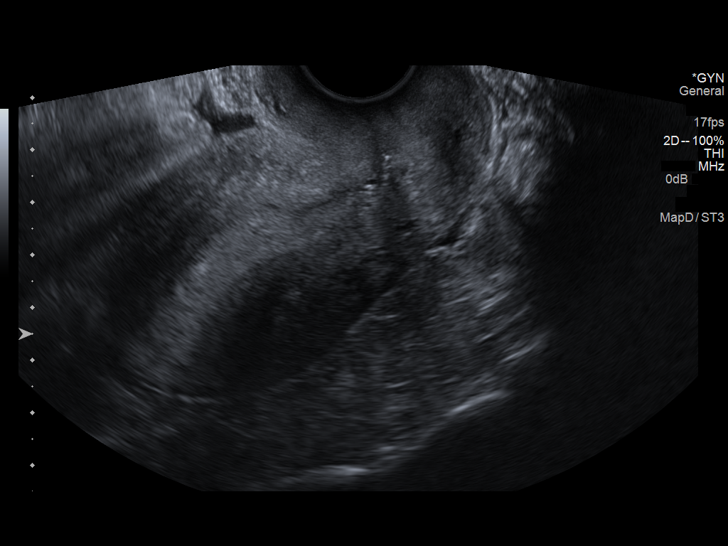
[im 20/52]
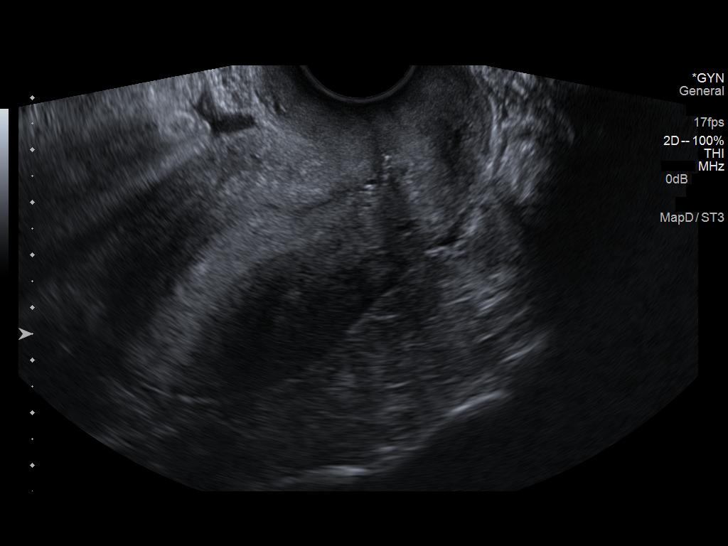
[im 24/52]
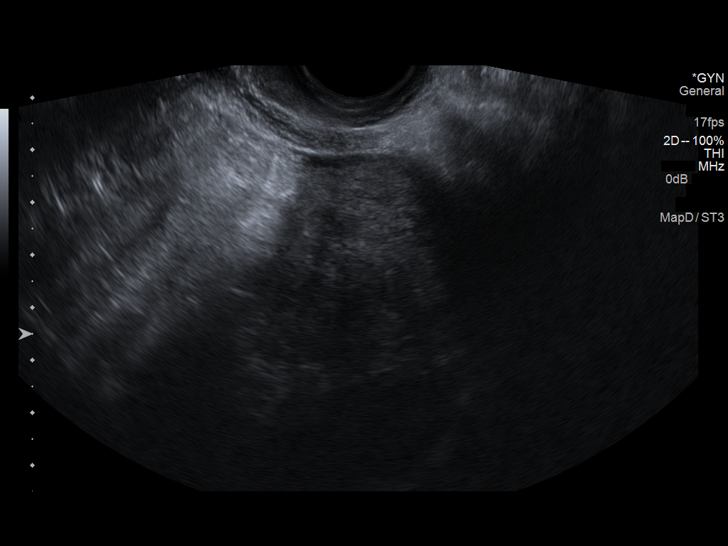
[im 28/52]
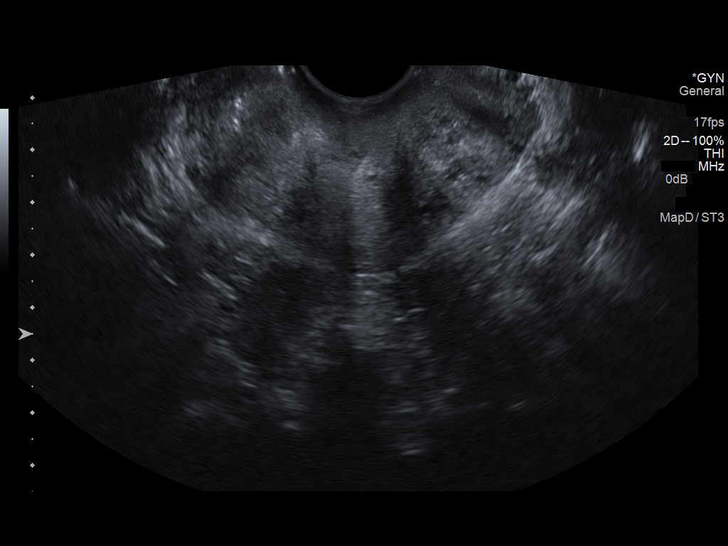
[im 32/52]
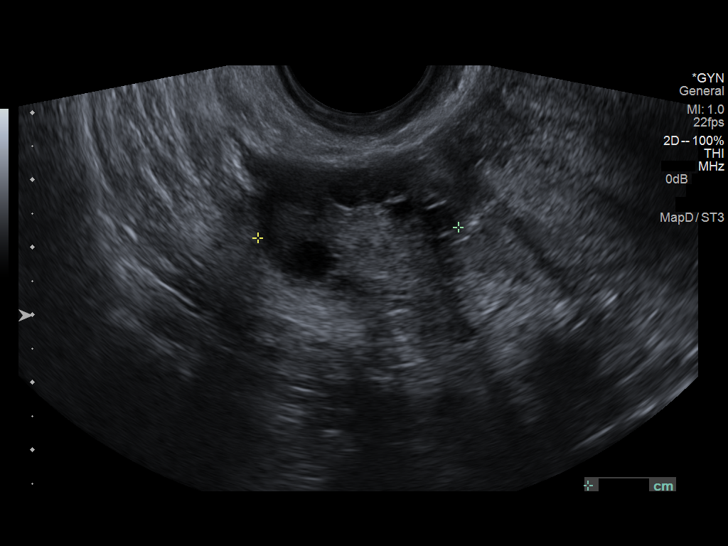
[im 35/52]
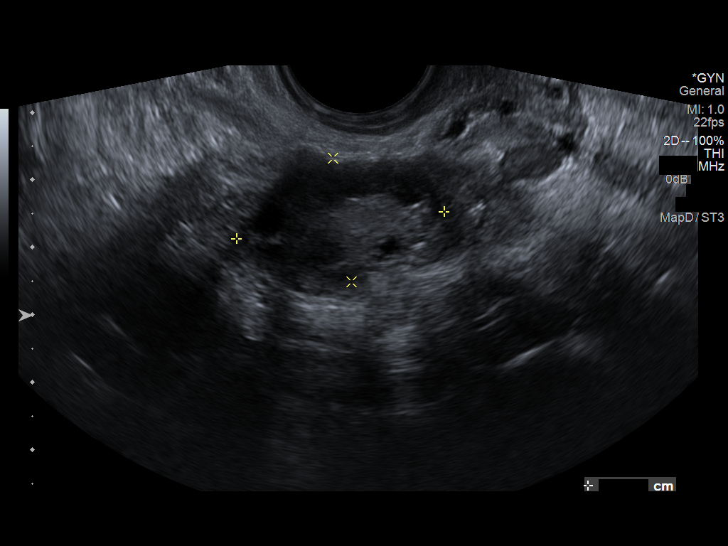
[im 39/52]
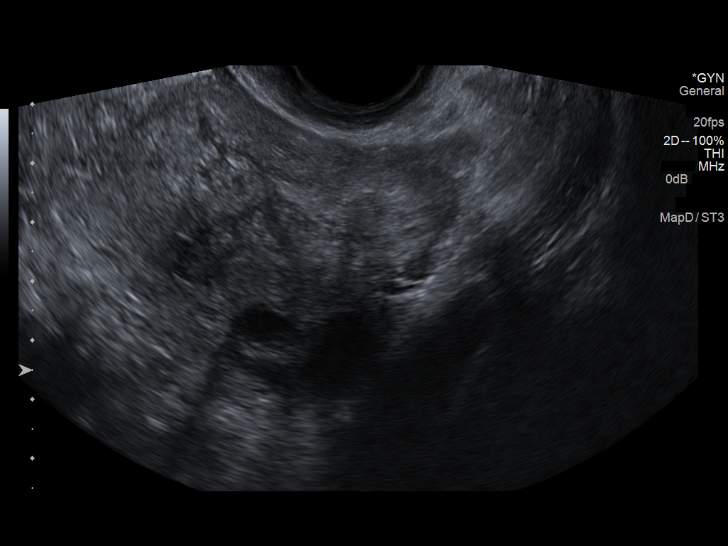
[im 43/52]
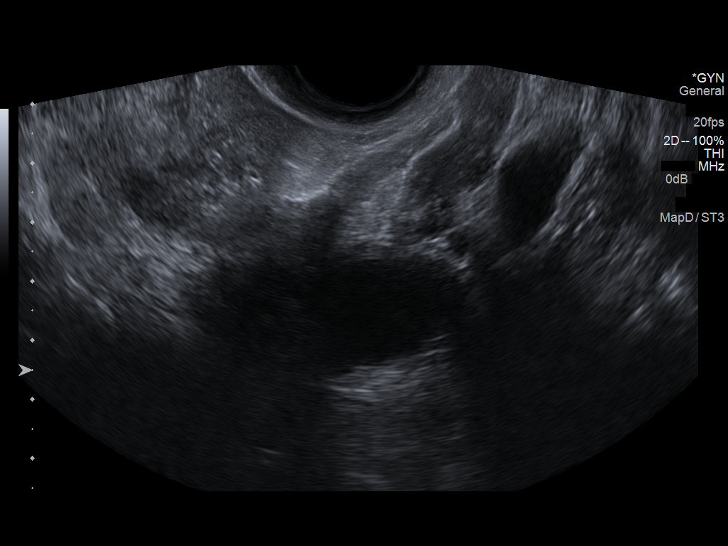
[im 47/52]
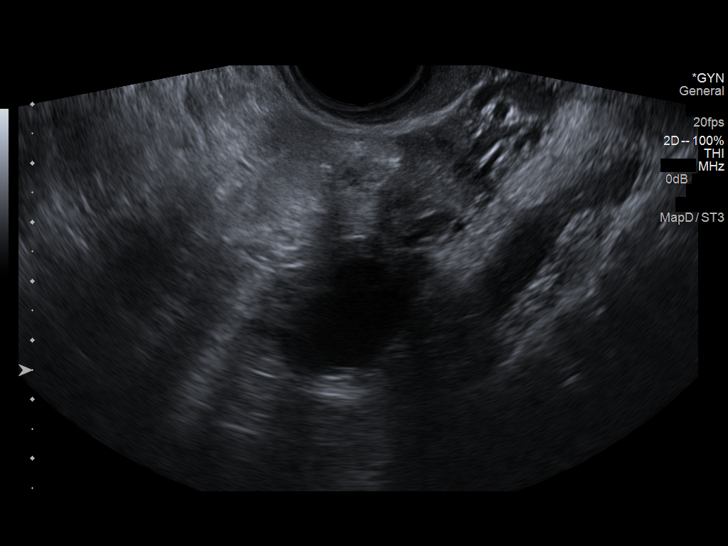
[im 52/52]
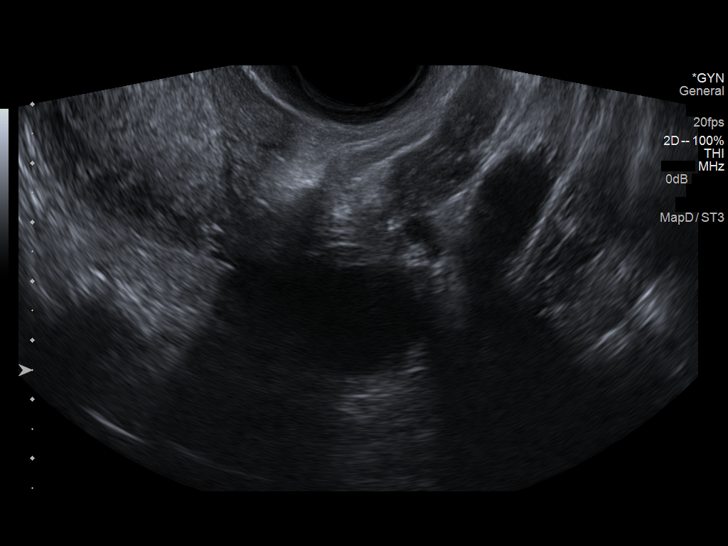

[14 of 25 positions shown; findings below may reference images not displayed]

FINDINGS: Uterus

Measurements: 9 x 5 x 6 cm. No fibroids or other mass visualized.

Endometrium

Thickness: 11 mm.  No focal abnormality visualized.

Right ovary

Measurements: 3.1 x 1.9 x 3 cm. Normal appearance/no adnexal mass.

Left ovary

Measurements: 3.5 x 2.3 x 3.9 cm. Dominant follicle, measuring
approximately 2.5 cm.

Other findings

No free fluid. Apparent mid level echoes in the dependent urinary
bladder appear transient, and are likely artifactual rather than
debris. A urinalysis has been obtained.
IMPRESSION: Negative pelvic ultrasound.

## 2014-05-18 ENCOUNTER — Encounter (HOSPITAL_COMMUNITY): Payer: Self-pay | Admitting: Emergency Medicine
